# Patient Record
Sex: Female | Born: 1955 | Smoking: Current every day smoker
Health system: Southern US, Community
[De-identification: ages and names within clinical notes are randomized; demographics above are authoritative.]

---

## 2019-11-01 ENCOUNTER — Other Ambulatory Visit: Payer: Self-pay | Admitting: Student

## 2019-11-01 ENCOUNTER — Other Ambulatory Visit (HOSPITAL_COMMUNITY): Payer: Self-pay | Admitting: Student

## 2019-11-01 DIAGNOSIS — Z122 Encounter for screening for malignant neoplasm of respiratory organs: Secondary | ICD-10-CM

## 2019-11-05 ENCOUNTER — Encounter: Payer: Self-pay | Admitting: *Deleted

## 2019-11-05 ENCOUNTER — Telehealth: Payer: Self-pay | Admitting: *Deleted

## 2019-11-05 DIAGNOSIS — Z87891 Personal history of nicotine dependence: Secondary | ICD-10-CM

## 2019-11-05 NOTE — Telephone Encounter (Signed)
Received referral for initial lung cancer screening scan. Contacted patient and obtained smoking history,(current, 69 pack year) as well as answering questions related to screening process. Patient denies signs of lung cancer such as weight loss or hemoptysis. Patient denies comorbidity that would prevent curative treatment if lung cancer were found. Patient is scheduled for shared decision making visit and CT scan on 11/19/19 at 130pm.

## 2019-11-08 ENCOUNTER — Ambulatory Visit: Payer: Self-pay

## 2019-11-19 ENCOUNTER — Inpatient Hospital Stay: Payer: Self-pay | Attending: Nurse Practitioner | Admitting: Hospice and Palliative Medicine

## 2019-11-19 ENCOUNTER — Other Ambulatory Visit: Payer: Self-pay

## 2019-11-19 ENCOUNTER — Ambulatory Visit
Admission: RE | Admit: 2019-11-19 | Discharge: 2019-11-19 | Disposition: A | Discharge status: Home / Self Care | Payer: Self-pay | Source: Ambulatory Visit | Attending: Nurse Practitioner | Admitting: Nurse Practitioner

## 2019-11-19 DIAGNOSIS — Z87891 Personal history of nicotine dependence: Secondary | ICD-10-CM | POA: Insufficient documentation

## 2019-11-19 DIAGNOSIS — F17210 Nicotine dependence, cigarettes, uncomplicated: Secondary | ICD-10-CM

## 2019-11-19 NOTE — Progress Notes (Signed)
In accordance with CMS guidelines, patient has met eligibility criteria including age, absence of signs or symptoms of lung cancer.  Social History   Tobacco Use  . Smoking status: Current Every Day Smoker    Packs/day: 1.50    Years: 46.00    Pack years: 69.00    Types: Cigarettes  Substance Use Topics  . Alcohol use: Not on file  . Drug use: Not on file      A shared decision-making session was conducted prior to the performance of CT scan. This includes one or more decision aids, includes benefits and harms of screening, follow-up diagnostic testing, over-diagnosis, false positive rate, and total radiation exposure.   Counseling on the importance of adherence to annual lung cancer LDCT screening, impact of co-morbidities, and ability or willingness to undergo diagnosis and treatment is imperative for compliance of the program.   Counseling on the importance of continued smoking cessation for former smokers; the importance of smoking cessation for current smokers, and information about tobacco cessation interventions have been given to patient including [place] and [facility] quit [place] programs.   Written order for lung cancer screening with LDCT has been given to the patient and any and all questions have been answered to the best of my abilities.    Yearly follow up will be coordinated by Burgess Estelle, Thoracic Navigator.  Time Total: 15 minutes  Visit consisted of counseling and education dealing with complex health screening. Greater than 50%  of this time was spent counseling and coordinating care related to the above assessment and plan.  Signed by: Altha Harm, PhD, NP-C [phone] (Work Cell)

## 2019-11-20 ENCOUNTER — Encounter: Payer: Self-pay | Admitting: *Deleted

## 2020-11-09 ENCOUNTER — Telehealth: Payer: Self-pay | Admitting: *Deleted

## 2020-11-09 NOTE — Telephone Encounter (Signed)
Contacted in attempt to schedule lung screening scan. Patient reports she is relocating to [place]. Stressed the importance of adhering to recommended annual lung screening at new location. Patient verbalizes understanding.

## 2021-01-12 IMAGING — CT CT CHEST LUNG CANCER SCREENING LOW DOSE W/O CM
2 of 5 series · 15 of 40 positions shown, 18 images · non-contrast
Comparison: None.

CLINICAL DATA: 63-year-old female current smoker, with 69 pack-year
history of smoking, for initial lung cancer screening

EXAM:
CT CHEST WITHOUT CONTRAST LOW-DOSE FOR LUNG CANCER SCREENING
TECHNIQUE: Multidetector CT imaging of the chest was performed following the
standard protocol without IV contrast.

[Series 3: lung 1.00 · axial · 0.61mm/px · z∈[-1219,-904]mm · 12 of 347 slices shown, 15 images]
[im 16/347  mediastinal]
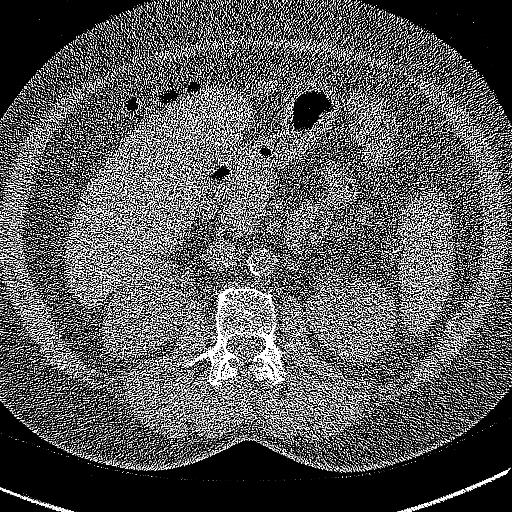
[im 16/347  lung]
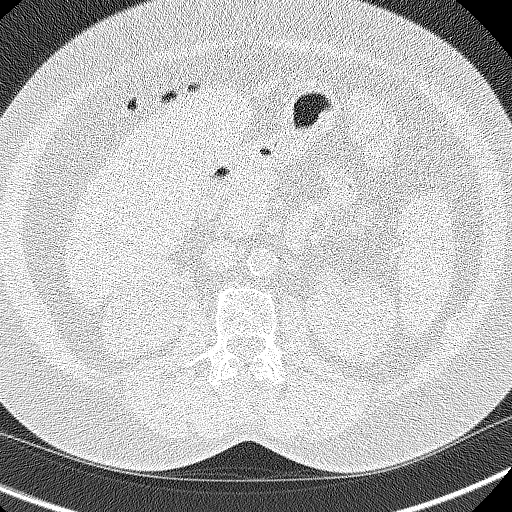
[im 48/347  lung]
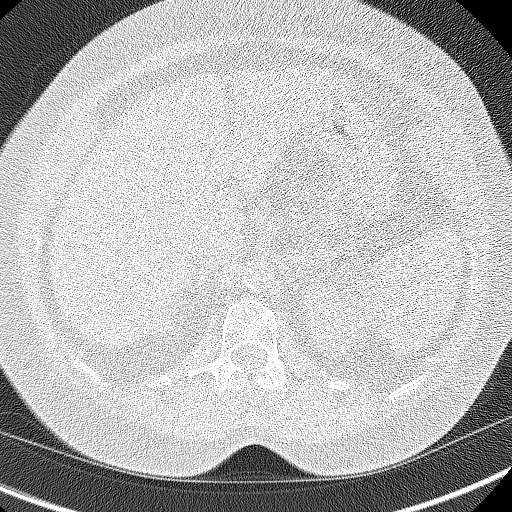
[im 79/347  lung]
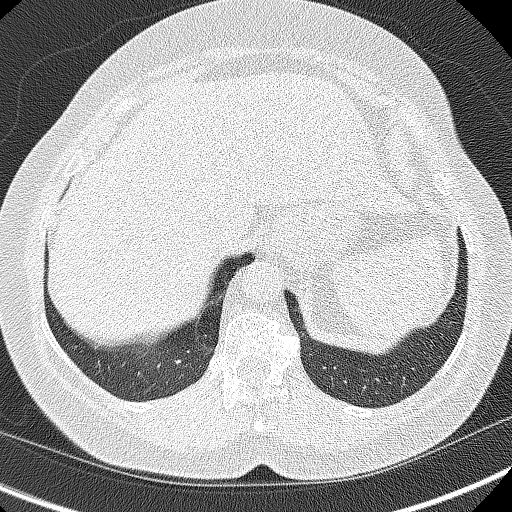
[im 111/347  lung]
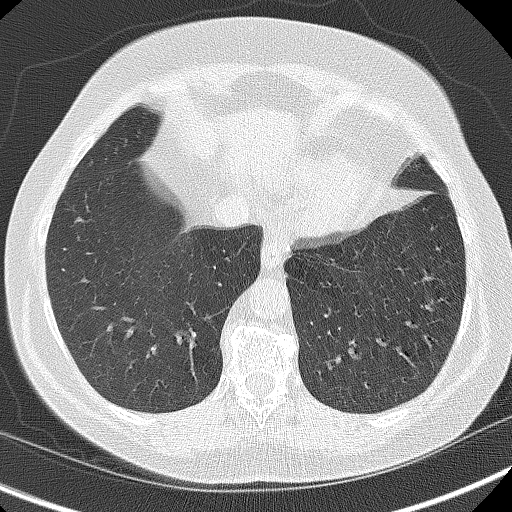
[im 126/347  mediastinal]
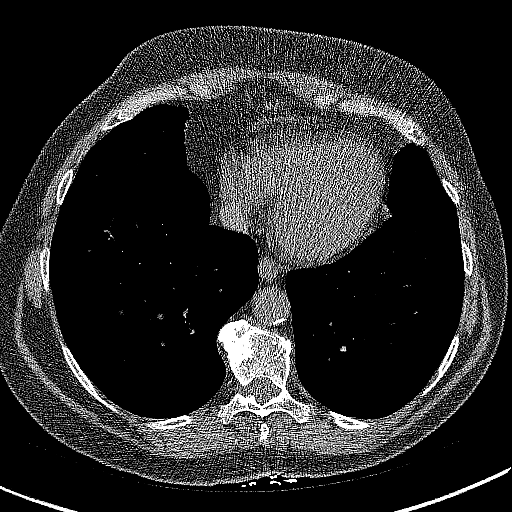
[im 126/347  lung]
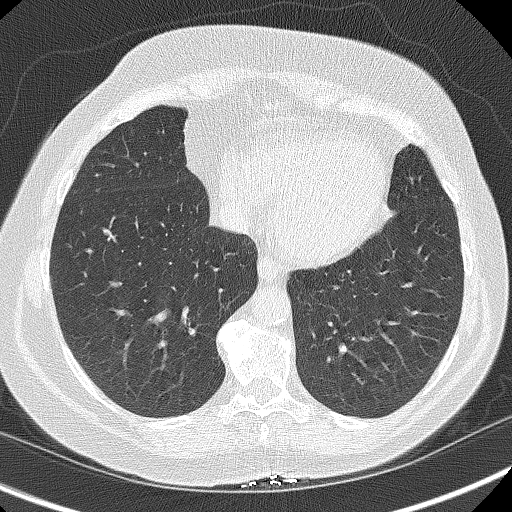
[im 158/347  lung]
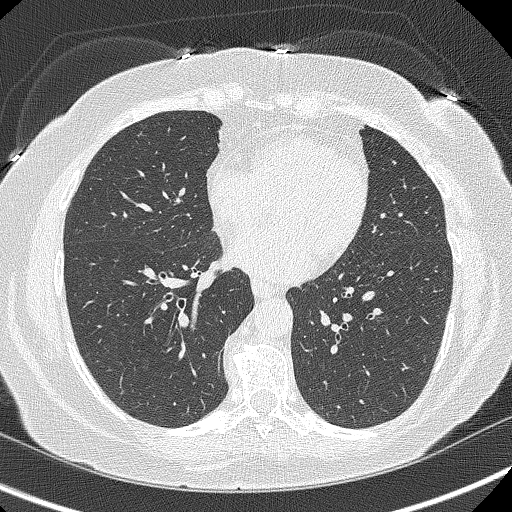
[im 189/347  lung]
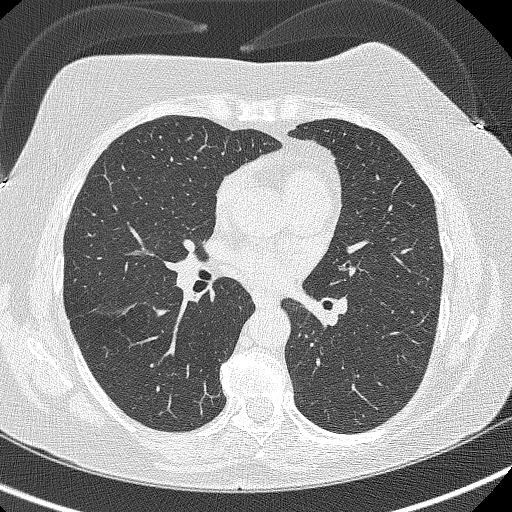
[im 221/347  lung]
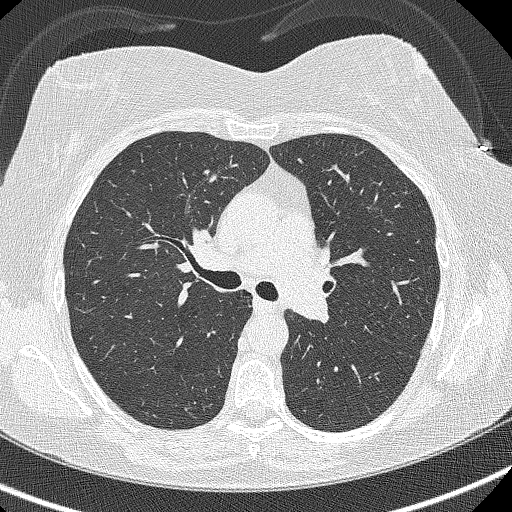
[im 236/347  mediastinal]
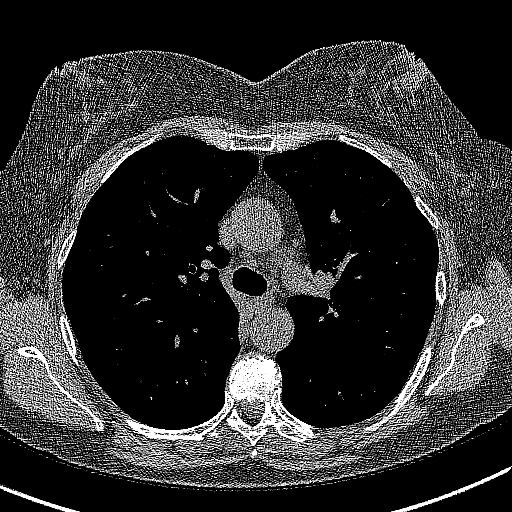
[im 236/347  lung]
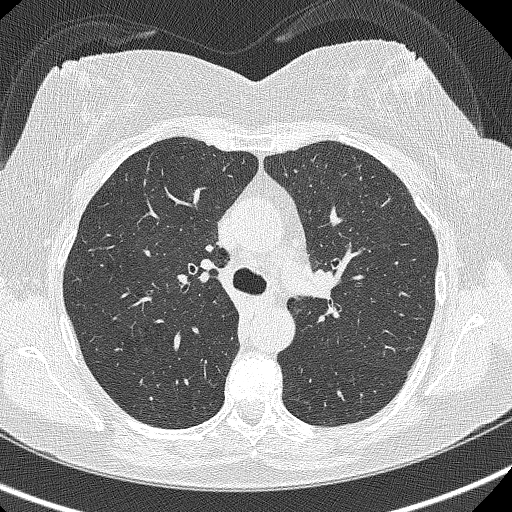
[im 268/347  lung]
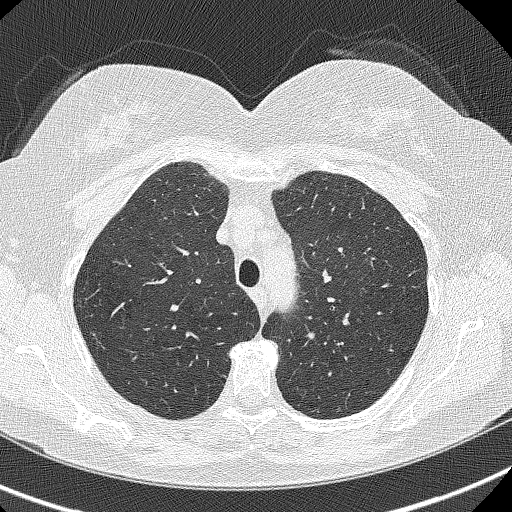
[im 299/347  lung]
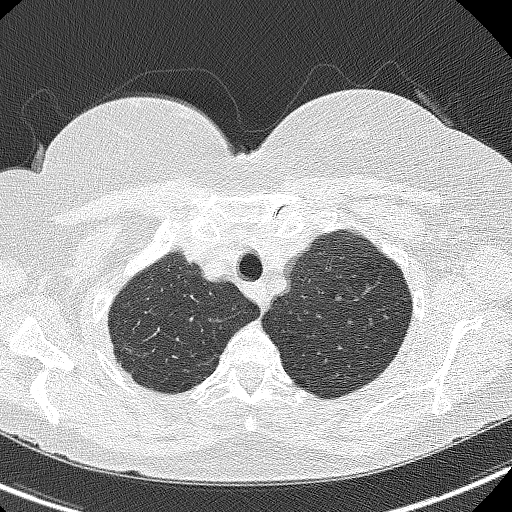
[im 331/347  lung]
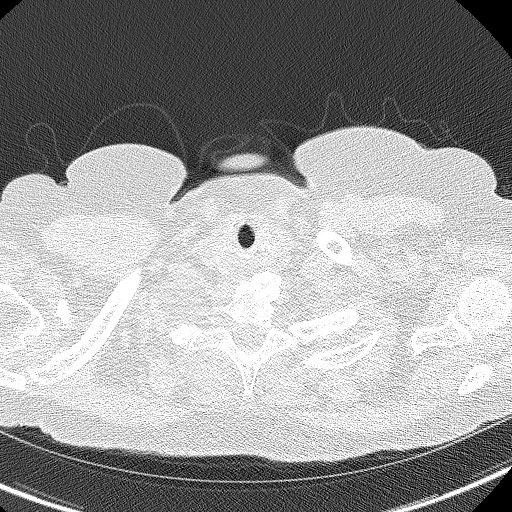

[Series 4: coronals lung 1.00 cor · coronal · 0.61mm/px · 3 of 290 slices shown]
[im 58/290  lung]
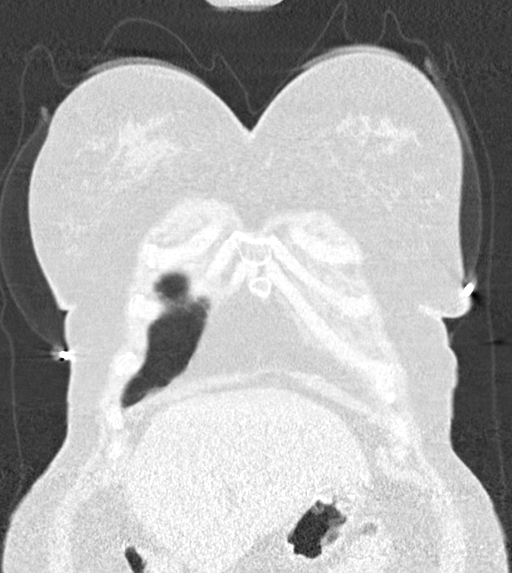
[im 116/290  lung]
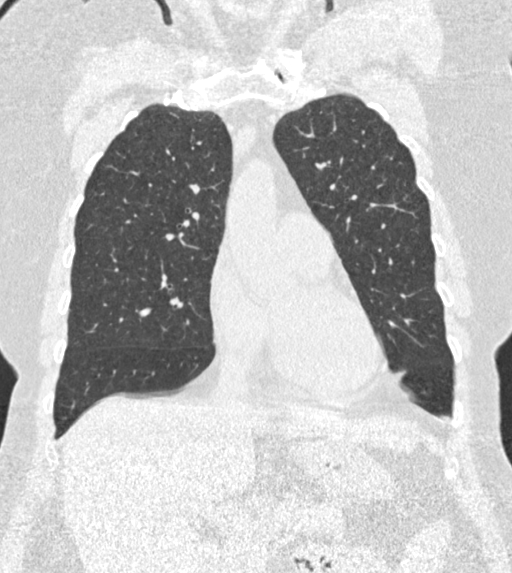
[im 174/290  lung]
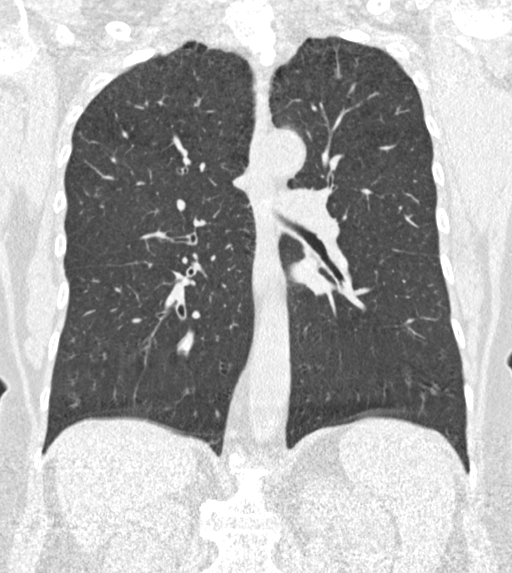

[15 of 40 positions shown; findings below may reference images not displayed]

FINDINGS: Cardiovascular: The heart is normal in size. No pericardial
effusion.

No evidence of thoracic aortic aneurysm. Atherosclerotic
calcifications of the aortic arch.

Mild coronary atherosclerosis of the left circumflex.

Mediastinum/Nodes: Small mediastinal lymph nodes which do not meet
pathologic CT size criteria.

Visualized thyroid is unremarkable.

Lungs/Pleura: Biapical pleural-parenchymal scarring, right greater
than left.

Mild centrilobular emphysematous changes, upper lung predominant.

No focal consolidation.

Evaluation of the lung parenchyma is constrained by respiratory
motion. Within that constraint, there are no suspicious pulmonary
nodules.

No pleural effusion or pneumothorax.

Upper Abdomen: Visualized upper abdomen is grossly unremarkable,
noting vascular calcifications.

Musculoskeletal: Mild degenerative changes of the visualized
thoracolumbar spine.
IMPRESSION: Lung-RADS 1, negative. Continue annual screening with low-dose chest
CT without contrast in 12 months.

Aortic Atherosclerosis ([id number]-GM0.0) and Emphysema ([id number].S).
